# Patient Record
Sex: Female | Born: 1950 | Race: Black or African American | Hispanic: No | Smoking: Never smoker
Health system: Southern US, Community
[De-identification: ages and names within clinical notes are randomized; demographics above are authoritative.]

## PROBLEM LIST (undated history)

## (undated) DIAGNOSIS — E079 Disorder of thyroid, unspecified: Secondary | ICD-10-CM

## (undated) DIAGNOSIS — Z91018 Allergy to other foods: Secondary | ICD-10-CM

## (undated) HISTORY — PX: CHOLECYSTECTOMY: SHX55

## (undated) HISTORY — PX: KNEE SURGERY: SHX244

## (undated) HISTORY — PX: THYROIDECTOMY: SHX17

## (undated) HISTORY — PX: SMALL INTESTINE SURGERY: SHX150

## (undated) HISTORY — PX: CARPAL TUNNEL RELEASE: SHX101

## (undated) HISTORY — DX: Disorder of thyroid, unspecified: E07.9

## (undated) HISTORY — PX: TUMOR REMOVAL: SHX12

## (undated) HISTORY — PX: PARTIAL HYSTERECTOMY: SHX80

## (undated) HISTORY — DX: Allergy to other foods: Z91.018

---

## 2006-02-23 ENCOUNTER — Ambulatory Visit: Payer: Self-pay | Admitting: Orthopedic Surgery

## 2006-03-09 ENCOUNTER — Ambulatory Visit: Payer: Self-pay | Admitting: Orthopedic Surgery

## 2006-04-11 ENCOUNTER — Ambulatory Visit: Payer: Self-pay | Admitting: Orthopedic Surgery

## 2006-04-18 ENCOUNTER — Ambulatory Visit (HOSPITAL_COMMUNITY): Admission: RE | Admit: 2006-04-18 | Discharge: 2006-04-18 | Payer: Self-pay | Admitting: Orthopedic Surgery

## 2006-04-25 ENCOUNTER — Ambulatory Visit: Payer: Self-pay | Admitting: Orthopedic Surgery

## 2006-05-10 ENCOUNTER — Encounter: Payer: Self-pay | Admitting: Orthopedic Surgery

## 2006-05-10 ENCOUNTER — Ambulatory Visit (HOSPITAL_COMMUNITY): Admission: RE | Admit: 2006-05-10 | Discharge: 2006-05-10 | Payer: Self-pay | Admitting: Orthopedic Surgery

## 2006-05-12 ENCOUNTER — Ambulatory Visit: Payer: Self-pay | Admitting: Orthopedic Surgery

## 2006-05-23 ENCOUNTER — Ambulatory Visit: Payer: Self-pay | Admitting: Orthopedic Surgery

## 2006-10-18 ENCOUNTER — Ambulatory Visit: Payer: Self-pay | Admitting: Orthopedic Surgery

## 2006-11-01 ENCOUNTER — Ambulatory Visit (HOSPITAL_COMMUNITY): Admission: RE | Admit: 2006-11-01 | Discharge: 2006-11-01 | Payer: Self-pay | Admitting: Orthopedic Surgery

## 2006-11-01 ENCOUNTER — Ambulatory Visit: Payer: Self-pay | Admitting: Orthopedic Surgery

## 2006-11-02 ENCOUNTER — Ambulatory Visit: Payer: Self-pay | Admitting: Orthopedic Surgery

## 2006-11-16 ENCOUNTER — Ambulatory Visit: Payer: Self-pay | Admitting: Orthopedic Surgery

## 2006-11-30 ENCOUNTER — Ambulatory Visit: Payer: Self-pay | Admitting: Orthopedic Surgery

## 2006-12-21 ENCOUNTER — Ambulatory Visit: Payer: Self-pay | Admitting: Orthopedic Surgery

## 2007-01-19 ENCOUNTER — Ambulatory Visit: Payer: Self-pay | Admitting: Orthopedic Surgery

## 2007-02-15 ENCOUNTER — Ambulatory Visit: Payer: Self-pay | Admitting: Orthopedic Surgery

## 2007-03-29 ENCOUNTER — Ambulatory Visit: Payer: Self-pay | Admitting: Orthopedic Surgery

## 2007-05-11 ENCOUNTER — Ambulatory Visit: Payer: Self-pay | Admitting: Orthopedic Surgery

## 2007-06-26 ENCOUNTER — Ambulatory Visit: Payer: Self-pay | Admitting: Orthopedic Surgery

## 2007-12-27 DIAGNOSIS — G4730 Sleep apnea, unspecified: Secondary | ICD-10-CM | POA: Insufficient documentation

## 2007-12-27 DIAGNOSIS — J45909 Unspecified asthma, uncomplicated: Secondary | ICD-10-CM | POA: Insufficient documentation

## 2007-12-28 ENCOUNTER — Ambulatory Visit: Payer: Self-pay | Admitting: Orthopedic Surgery

## 2007-12-28 DIAGNOSIS — M25569 Pain in unspecified knee: Secondary | ICD-10-CM | POA: Insufficient documentation

## 2007-12-28 DIAGNOSIS — M654 Radial styloid tenosynovitis [de Quervain]: Secondary | ICD-10-CM | POA: Insufficient documentation

## 2007-12-28 DIAGNOSIS — IMO0002 Reserved for concepts with insufficient information to code with codable children: Secondary | ICD-10-CM | POA: Insufficient documentation

## 2007-12-28 DIAGNOSIS — M1710 Unilateral primary osteoarthritis, unspecified knee: Secondary | ICD-10-CM

## 2008-01-19 ENCOUNTER — Emergency Department (HOSPITAL_COMMUNITY): Admission: EM | Admit: 2008-01-19 | Discharge: 2008-01-19 | Payer: Self-pay | Admitting: Emergency Medicine

## 2008-04-23 ENCOUNTER — Telehealth: Payer: Self-pay | Admitting: Orthopedic Surgery

## 2008-04-25 ENCOUNTER — Encounter: Payer: Self-pay | Admitting: Infectious Diseases

## 2008-07-04 ENCOUNTER — Ambulatory Visit: Payer: Self-pay | Admitting: Orthopedic Surgery

## 2009-01-15 ENCOUNTER — Ambulatory Visit: Payer: Self-pay | Admitting: Orthopedic Surgery

## 2009-01-27 ENCOUNTER — Telehealth: Payer: Self-pay | Admitting: Orthopedic Surgery

## 2009-12-26 ENCOUNTER — Emergency Department (HOSPITAL_COMMUNITY): Admission: EM | Admit: 2009-12-26 | Discharge: 2009-12-27 | Payer: Self-pay | Admitting: Emergency Medicine

## 2010-01-15 DIAGNOSIS — K8050 Calculus of bile duct without cholangitis or cholecystitis without obstruction: Secondary | ICD-10-CM | POA: Insufficient documentation

## 2010-07-11 ENCOUNTER — Emergency Department (HOSPITAL_COMMUNITY): Admission: EM | Admit: 2010-07-11 | Discharge: 2010-07-11 | Payer: Self-pay | Admitting: Emergency Medicine

## 2011-02-28 LAB — URINALYSIS, ROUTINE W REFLEX MICROSCOPIC
Glucose, UA: NEGATIVE mg/dL
Hgb urine dipstick: NEGATIVE
Urobilinogen, UA: 0.2 mg/dL (ref 0.0–1.0)

## 2011-02-28 LAB — DIFFERENTIAL
Basophils Relative: 0 % (ref 0–1)
Lymphs Abs: 1.7 10*3/uL (ref 0.7–4.0)
Monocytes Absolute: 0.4 10*3/uL (ref 0.1–1.0)
Monocytes Relative: 5 % (ref 3–12)
Neutro Abs: 6.7 10*3/uL (ref 1.7–7.7)

## 2011-02-28 LAB — URINE MICROSCOPIC-ADD ON

## 2011-02-28 LAB — COMPREHENSIVE METABOLIC PANEL
AST: 28 U/L (ref 0–37)
Albumin: 4.2 g/dL (ref 3.5–5.2)
Alkaline Phosphatase: 147 U/L — ABNORMAL HIGH (ref 39–117)
BUN: 16 mg/dL (ref 6–23)
CO2: 25 mEq/L (ref 19–32)
Chloride: 103 mEq/L (ref 96–112)
Creatinine, Ser: 0.89 mg/dL (ref 0.4–1.2)
GFR calc Af Amer: >60 mL/min (ref >60)
Potassium: 4.4 mEq/L (ref 3.5–5.1)
Total Protein: 8 g/dL (ref 6.0–8.3)

## 2011-02-28 LAB — URINE CULTURE: Colony Count: 90000

## 2011-02-28 LAB — LIPASE, BLOOD: Lipase: 23 U/L (ref 11–59)

## 2011-02-28 LAB — CBC
HCT: 41.1 % (ref 36.0–46.0)
Hemoglobin: 13.6 g/dL (ref 12.0–15.0)
MCHC: 33 g/dL (ref 30.0–36.0)
MCV: 90.9 fL (ref 78.0–100.0)
Platelets: 208 K/uL (ref 150–400)
RBC: 4.52 MIL/uL (ref 3.87–5.11)
RDW: 16.4 % — ABNORMAL HIGH (ref 11.5–15.5)
WBC: 8.8 K/uL (ref 4.0–10.5)

## 2011-04-30 NOTE — H&P (Signed)
NAMEARNOLA, [name]            ACCOUNT NO.:  [id number]   MEDICAL RECORD NO.:  [id number]          PATIENT TYPE:  AMB   LOCATION:  DAY                           FACILITY:  [facility]   PHYSICIAN:  Vickki Hearing, M.D.DATE OF BIRTH:  1951-03-24   DATE OF ADMISSION:  DATE OF DISCHARGE:  LH                                HISTORY & PHYSICAL   CHIEF COMPLAINT:  Left knee pain.   HISTORY:  This is a 60 year old female who I have operated on for triggering  of the left thumb who presents with chronic left knee pain with MRI findings  indicating ligamentous injury, cartilage damage. She has not responded to  nonoperative treatment including bracing, anti-inflammatories. Symptoms have  become worse.  She presents for surgery. Presumptive diagnosis is ACL tear  and osteoarthritis, possible meniscal tear.   REVIEW OF SYSTEMS:  The patient's review of systems, she has a history of  weight loss and weight gain, fatigue, chest pain, shortness breath,  difficulty breathing, difficulty swallowing, gastroesophageal reflux,  headache, migraine, joint pain and swelling, thyroid disease, goiter,  depression, mood swing, eczema, itching, poor vision, sinusitis, toothache,  seasonal allergies.  She is also allergic to bee stings.  She has normal  genitourinary system and normal lymphatic system.   ALLERGIES:  Include penicillin, aspirin, codeine and multiple foods.   PAST MEDICAL HISTORY:  Problems of asthma, sleep apnea, Graves disease,  thyroid disease.   PAST SURGICAL HISTORY:  Hysterectomy, thyroidectomy, trigger release right  and left thumb.   MEDICATIONS:  1. Advair discus.  2. Synthroid 200 mcg a day.  3. Os-Cal 500 mg.  4. Docusate sodium 100 mg.  5. Nasacort aqueous.  6. Flonase 50 mg.  7. Protonix 40 mg.  8. Zoloft 25 mg.  9. Claritin 10 mg.  10.Zomig 2.5 mg as needed.  11.Singulair 10 mg.   FAMILY HISTORY:  Arthritis, cancer and asthma.   SOCIAL HISTORY:  She is followed  by a physician in [place]. She is single.  She does not smoke, drink or use caffeine. Has a bachelor of science degree.   PHYSICAL EXAMINATION:  CONSTITUTIONAL:  Findings show normal vital signs.  VITALS:  Weight of approximately 210.  Body habitus, mesomorphic, nutrition  development, grooming and hygiene are normal.  CARDIOVASCULAR:  Shows normal  peripheral vascular system by observation and palpation.  CHEST: Clear.  HEART:  Rate and rhythm normal.  ABDOMEN:  Soft.  LYMPH SYSTEM:  Neck is  supple and without mass. MUSCULOSKELETAL:  Left knee shows medial  compartment tenderness. It is  positive meniscal signs.  Drawer test appears  stable. Lachman test appears pretty stable. Collateral ligaments appear  stable.  Motor exam 5/5 manual muscle testing. No joint effusion is noted.  NEURO:  She was awake, alert and oriented x3.  Sensory findings were normal.   IMPRESSION:  ACL tear, osteoarthritis/cartilage damage. Possible meniscal  tear.   PLAN:  Arthroscopy left knee.   The patient understands the following:  That surgery may or may not help her  knee.  I do not think she is a reconstruction surgical candidate  based on  age and activity level.  She may have trouble after this with her arthritis  or instability episodes. At this point we will clean the knee out as best we  can.  We will address any cartilage issues as they are encountered.  The ACL  will be addressed with debridement if it is causing impingement in the  joint.  Otherwise it will be left as is.      Vickki Hearing, M.D.  Electronically Signed     SEH/MEDQ  D:  10/31/2006  T:  10/31/2006  Job:  [id number]

## 2011-04-30 NOTE — Op Note (Signed)
NAMEVERBIE, BABIC            ACCOUNT NO.:  [id number]   MEDICAL RECORD NO.:  [id number]          PATIENT TYPE:  AMB   LOCATION:  DAY                           FACILITY:  [facility]   PHYSICIAN:  Vickki Hearing, M.D.DATE OF BIRTH:  [birth year]   DATE OF PROCEDURE:  05/10/2006  DATE OF DISCHARGE:                                 OPERATIVE REPORT   PREOPERATIVE DIAGNOSIS:  Trigger thumb, left upper extremity.   POSTOPERATIVE DIAGNOSIS:  Trigger thumb, left upper extremity.   PROCEDURE:  A1 pulley release, left thumb.   SURGEON:  Vickki Hearing, M.D.   ANESTHETIC:  Regional block.   FINDINGS:  Stenotic A1 pulley.  No other findings.  Steroid medication  previous injected was noted.   HISTORY:  A 60 year old female with chronic triggering treated with  injection did not improve, presented for A1 pulley release due to persistent  pain and locking of the finger.   The patient was identified as [name] in the preop holding area.  Her surgical site was marked, countersigned by the surgeon, vancomycin was  administered and she was taken to the operating room Bier block.  After  successful Bier block, sterile prep and drape was performed, time-out was  completed.  The procedure was confirmed as an A1 pulley release from the  left thumb on [employer].  Antibiotics had been given.   An incision was made in the transverse crease of the metacarpophalangeal  joint area.  The nerve was seen, encountered and protected, the A1 pulley  was released.  Flexion/extension of the finger confirmed good mobility of  the tendon with no locking or clicking, and the wound was irrigated and  closed in interrupted fashion with 3-0 nylon suture.   The patient was taken to the recovery room in stable addition, discharged on  Vicodin 5 mg one to two p.o. q.4h. p.r.n. for pain, #60.  Follow-up  scheduled for 2 days.      Vickki Hearing, M.D.  Electronically Signed     SEH/MEDQ  D:  05/10/2006  T:  05/10/2006  Job:  [id number]

## 2011-04-30 NOTE — Op Note (Signed)
NAMEMARSHAE, [name]            ACCOUNT NO.:  [id number]   MEDICAL RECORD NO.:  [id number]          PATIENT TYPE:  AMB   LOCATION:  DAY                           FACILITY:  [facility]   PHYSICIAN:  Vickki Hearing, M.D.DATE OF BIRTH:  1951/08/01   DATE OF PROCEDURE:  11/01/2006  DATE OF DISCHARGE:                               OPERATIVE REPORT   HISTORY:  This is a 60 year old female who failed conservative treatment  for left knee pain.  MRI showed possible medial meniscal tear chondral  injury with osteoarthritis, possible ACL tear.  The patient was having  instability and mechanical symptoms and presented for surgery.   PREOP DIAGNOSES:  1. Torn medial meniscus, left knee.  2. Osteoarthritis, left knee.  3. Anterior cruciate ligament tear, left knee.   POSTOP DIAGNOSES:  1. Osteoarthritis, left knee.  2. Torn medial meniscus, left knee.   OPERATIVE FINDINGS:  There was a free edge tear of the medial meniscus.  There was a chondral flap with loose cartilaginous flap of noted from  the medial femoral condyle.  There were grade 3 changes on the medial  femoral, grade 2 changes on the patella, and grade 2 changes on the  trochlea.  The medial femoral condylar lesion involved a significant  part of the weightbearing surface of the femur.  The lateral meniscus  was intact and normal.  ACL was also intact as was the PCL.  There was  some mild sinusitis.   The patient was identified as [name], her left knee was marked  as the surgical site, and countersigned by the surgeon.  The history and  physical was updated.  The patient was allergic to several medications  including PENICILLIN which caused swelling.  We opted to go with  clindamycin; this medicine was started, and confirmed to be given within  an hour of skin incision.   The patient was taken back to the operating room where a spinal  anesthetic was done.  She was placed supine on the operating table and  her  left leg was placed in an arthroscopic leg holder.  Right leg was  padded and left in flexion.  After sterile prep and drape of the left  lower extremity.  The time-out procedure was completed; and the  procedure was confirmed as a left knee arthroscopy, on [name];  again, antibiotics confirmed to be given within an hour of skin incision  using clindamycin.   A lateral portal was established with an 11-blade a diagnostic  arthroscopy was done viewing all compartments and gutters of the knee.  A medial portal was established as a working portal; and a probe was  inserted and the diagnostic arthroscopy was completed using the probe to  palpate the structures.   A straight duckbill forceps was used to trim the free edge tear of the  posterior horn of the medial meniscus.  The meniscus was then balanced  using a motorized shaver, a gentle chondroplasty was performed on the  medial femoral condyle until a stable rim was reached.   The scope was placed in the patellofemoral compartment where  a  chondroplasty was done on the patella, primarily involving the medial  facette and this was done until the crab-meat appearance had been  converted to a flattened surface.  The knee was then irrigated; and the  portals were closed using Steri-Strips.  An additional 20 mL of Marcaine  was injected into the knee via mid lateral patellar portal injection  site; and the knee was dressed sterilely and a cryo cuff was applied and  activated.   The patient was taken to the recovery room in stable condition.  She is  postop full weightbearing, follow up will be tomorrow.  She is  discharged on Dilaudid due to CODEINE allergy.  She is full  weightbearing as tolerated.      Vickki Hearing, M.D.  Electronically Signed     SEH/MEDQ  D:  11/01/2006  T:  11/01/2006  Job:  [id number]

## 2011-04-30 NOTE — H&P (Signed)
[name], [name]            ACCOUNT NO.:  [id number]   MEDICAL RECORD NO.:  [id number]          PATIENT TYPE:  AMB   LOCATION:  DAY                           FACILITY:  [facility]   PHYSICIAN:  Vickki Hearing, M.D.DATE OF BIRTH:  [birth year]   DATE OF ADMISSION:  DATE OF DISCHARGE:  LH                                HISTORY & PHYSICAL   CHIEF COMPLAINT:  Left thumb triggering.   HISTORY OF PRESENT ILLNESS:  This patient is 60 years old, has triggering of  the left thumb.  She has been treated with injections.  She has recurrence  of symptoms with persistent pain in the palm over the A-1 pulley and  presents for trigger thumb release.   REVIEW OF SYSTEMS:  A ten point review of systems reveals a history of  weight loss and gain, fatigue, chest pain, shortness of breath, difficulty  breathing, difficulty swallowing, gastroesophageal reflux disease, headache,  migraine, joint swelling, pain, thyroid disease, goiter, depression, mood  swing, eczema, itching, poor vision, sinusitis, toothaches, seasonal  allergies, ALLERGY to BEE STINGS. Urinary and lymph systems were normal.   ALLERGIES:  PENICILLIN, ASPIRIN, CODEINE, MULTIPLE FOOD ALLERGIES.   PAST MEDICAL HISTORY:  Medical problems of asthma, sleep apnea, Grave's  disease, thyroid disease.   PAST SURGICAL HISTORY:  Hysterectomy, thyroidectomy, right thumb trigger  release.   MEDICATIONS:  1.  Advair Diskus.  2.  Synthroid 200 mcg daily.  3.  Os-Cal 500 mg.  4.  Docusate sodium 100 mg.  5.  Nasacort AQ.  6.  Flonase 50 mcg.  7.  Protonix 40 mg.  8.  Zoloft 25 mg.  9.  Claritin 10 mg.  10. Zomig 2.5.  11. Singulair 10 mg.   FAMILY HISTORY:  Arthritis, cancer and asthma.   SOCIAL HISTORY:  She is followed in [place].  She is single. She does not  smoke, drink or use caffeine.  She has a Nature conservation officer.   PHYSICAL EXAMINATION:  VITAL SIGNS:  Constitutional findings show weight of  217. Pulse 90.   Respiratory rate 20.  Body habitus mesomorphic. Nutrition,  development, grooming, hygiene were normal.  CARDIOVASCULAR:  Peripheral vascular system observation normal palpation of  pulses, temperature, no edema, tenderness, swelling or varicose veins.  LYMPH SYSTEM:  Neck, epitrochlea regions normal.  MUSCULOSKELETAL:  Gait and station unremarkable.  Carpal tunnel signs were  negative.  Left thumb over the A-1 pulley showed tenderness and clicking,  though passive range of motion was normal.  Grip strength was poor.  All  joints were stable.  No swelling was noted.  Coordination, reflexes were  normal.  She was oriented x3.  Mood was normal.  Sensory findings to soft  touch were normal.   CLINICAL DATA:  No x-rays.   DIAGNOSIS:  Tenosynovitis of the flexor tendon of the left thumb.   RECOMMENDATIONS:  Trigger release of the left thumb.      Vickki Hearing, M.D.  Electronically Signed     SEH/MEDQ  D:  05/09/2006  T:  05/09/2006  Job:  [id number]   cc:  [facility]  Fax: [phone]

## 2011-09-03 LAB — URINALYSIS, ROUTINE W REFLEX MICROSCOPIC
Glucose, UA: NEGATIVE
Hgb urine dipstick: NEGATIVE
Nitrite: NEGATIVE
Protein, ur: NEGATIVE

## 2011-09-03 LAB — URINE CULTURE: Colony Count: 3000

## 2011-11-25 ENCOUNTER — Encounter: Payer: Self-pay | Admitting: Orthopedic Surgery

## 2011-11-25 ENCOUNTER — Ambulatory Visit (INDEPENDENT_AMBULATORY_CARE_PROVIDER_SITE_OTHER): Payer: BC Managed Care – PPO | Admitting: Orthopedic Surgery

## 2011-11-25 VITALS — BP 100/60 | Ht 61.0 in | Wt 222.0 lb

## 2011-11-25 DIAGNOSIS — IMO0002 Reserved for concepts with insufficient information to code with codable children: Secondary | ICD-10-CM

## 2011-11-25 DIAGNOSIS — M6530 Trigger finger, unspecified finger: Secondary | ICD-10-CM

## 2011-11-25 DIAGNOSIS — M1710 Unilateral primary osteoarthritis, unspecified knee: Secondary | ICD-10-CM | POA: Insufficient documentation

## 2011-11-25 DIAGNOSIS — M179 Osteoarthritis of knee, unspecified: Secondary | ICD-10-CM

## 2011-11-25 NOTE — Patient Instructions (Signed)
You have received a steroid shot. 15% of patients experience increased pain at the injection site with in the next 24 hours. This is best treated with ice and tylenol extra strength 2 tabs every 8 hours. If you are still having pain please call the office.    

## 2011-11-25 NOTE — Progress Notes (Signed)
Patient ID: [name], female   DOB: 1951-11-01, 60 y.o.   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  She also has thyroid disease.  She complains of catching and locking of the long fingers of both the RIGHT and LEFT hand with severe pain and locking requiring digital manipulation  Her knees are improved with ibuprofen though still symptomatic with mild to moderate pain depending on her activities.  She does not have any catching locking or giving way.  Past Medical History  Diagnosis Date  . Thyroid disease   . Asthma   . Allergic rhinitis   . Food allergy    Past Surgical History  Procedure Date  . Partial hysterectomy   . Thyroidectomy   . Cholecystectomy   . Carpal tunnel release     bilateral  . Knee surgery     left  . Tumor removal   . Small intestine surgery    No current outpatient prescriptions on file prior to visit.    X-rays separately unidentifiable  Bilateral knee films 3 views RIGHT and LEFT knee  RIGHT knee first  Alignment slight varus, joint space narrowing medial and lateral and patellofemoral.  Patellofemoral articulation the patella centered.  Impression osteoarthritis RIGHT knee moderate  LEFT knee aligned a slight varus, joint space narrowing medial and lateral and patellofemoral with patello-femoral articulation degenerative with cemented patella  Impression osteoarthritis LEFT knee moderate  I gave her to trigger finger injections  Procedure note trigger finger injection  Diagnosis trigger finger Postop diagnosis trigger finger Procedure injection of trigger finger Finger injected RIGHT long finger and LEFT long finger  Details of procedure: After verbal consent and timeout to confirm site the RIGHT long finger was injected with 1 cc of 40 mg of  Depo-Medrol and 1 cc of 1% lidocaine this was then repeated on the LEFT finger  The procedure was tolerated well without complication

## 2012-05-30 ENCOUNTER — Ambulatory Visit: Payer: BC Managed Care – PPO | Admitting: Orthopedic Surgery

## 2012-05-31 ENCOUNTER — Ambulatory Visit (INDEPENDENT_AMBULATORY_CARE_PROVIDER_SITE_OTHER): Payer: BC Managed Care – PPO | Admitting: Orthopedic Surgery

## 2012-05-31 ENCOUNTER — Encounter: Payer: Self-pay | Admitting: Orthopedic Surgery

## 2012-05-31 VITALS — BP 90/68 | Ht 61.0 in | Wt 230.0 lb

## 2012-05-31 DIAGNOSIS — M179 Osteoarthritis of knee, unspecified: Secondary | ICD-10-CM

## 2012-05-31 DIAGNOSIS — M1710 Unilateral primary osteoarthritis, unspecified knee: Secondary | ICD-10-CM

## 2012-05-31 DIAGNOSIS — IMO0002 Reserved for concepts with insufficient information to code with codable children: Secondary | ICD-10-CM

## 2012-05-31 DIAGNOSIS — M6530 Trigger finger, unspecified finger: Secondary | ICD-10-CM

## 2012-05-31 NOTE — Patient Instructions (Addendum)
You have received a steroid shot. 15% of patients experience increased pain at the injection site with in the next 24 hours. This is best treated with ice and tylenol extra strength 2 tabs every 8 hours. If you are still having pain please call the office.    

## 2012-06-05 ENCOUNTER — Encounter: Payer: Self-pay | Admitting: Orthopedic Surgery

## 2012-06-05 NOTE — Progress Notes (Signed)
Patient ID: [name], female   DOB: 07/31/1951, 61 y.o.   MRN: [id number] Chief Complaint  Patient presents with  . Follow-up    6 month recheck on knees.    She also has triggering of her right upper extremity long finger  Bilateral knee pain aching, denies loss of motion denies weakness denies locking clicking or mechanical symptoms  Past Medical History  Diagnosis Date  . Thyroid disease   . Asthma   . Allergic rhinitis   . Food allergy     Past Surgical History  Procedure Date  . Partial hysterectomy   . Thyroidectomy   . Cholecystectomy   . Carpal tunnel release     bilateral  . Knee surgery     left  . Tumor removal   . Small intestine surgery     BP 90/68  Ht 5\' 1"  (1.549 m)  Wt 104.327 kg (230 lb)  BMI 43.46 kg/m2  General appearance patient is mildly obese but otherwise normal in appearance  She is alert or oriented x3 mood and affect are normal  Skin warm dry and intact right hand and both lower extremities  Pulses normal right upper extremity and both lower extremity  No edema  Neurological exam intact  Right and left knee range of motion and exam is similar with flexion arc 120, Varus deformity to medial joint line tenderness ligaments are stable muscle tone is normal. Right hand tenderness over the A1 pulley of the long finger with no locking or clicking. Flexion power is normal. Appearance of the hand shows a normal cascade of the hand. Her vascular exam intact.  Impression bilateral osteoarthritis of the knee Impression right trigger finger  The patient would like injections done on all 3 areas  Knee  Injection Procedure Note  Pre-operative Diagnosis: left knee oa  Post-operative Diagnosis: same  Indications: pain  Anesthesia: ethyl chloride   Procedure Details   Verbal consent was obtained for the procedure. Time out was completed.The joint was prepped with alcohol, followed by  Ethyl chloride spray and A 20 gauge needle was  inserted into the knee via lateral approach; 4ml 1% lidocaine and 1 ml of depomedrol  was then injected into the joint . The needle was removed and the area cleansed and dressed.  Complications:  None; patient tolerated the procedure well.  Knee  Injection Procedure Note  Pre-operative Diagnosis: right knee oa  Post-operative Diagnosis: same  Indications: pain  Anesthesia: ethyl chloride   Procedure Details   Verbal consent was obtained for the procedure. Time out was completed.The joint was prepped with alcohol, followed by  Ethyl chloride spray and A 20 gauge needle was inserted into the knee via lateral approach; 4ml 1% lidocaine and 1 ml of depomedrol  was then injected into the joint . The needle was removed and the area cleansed and dressed.  Complications:  None; patient tolerated the procedure well.  Procedure note trigger finger injection  Diagnosis trigger finger Postop diagnosis trigger finger Procedure injection of trigger finger Finger injected RIGHT long finger  Details of procedure: After verbal consent and timeout to confirm site the RIGHT long finger was injected with 1 cc of 40 mg of Depo-Medrol and 1 cc of 1% lidocaine  The procedure was tolerated well without complication

## 2012-11-29 ENCOUNTER — Encounter: Payer: Self-pay | Admitting: Orthopedic Surgery

## 2012-11-29 ENCOUNTER — Ambulatory Visit (INDEPENDENT_AMBULATORY_CARE_PROVIDER_SITE_OTHER): Payer: BC Managed Care – PPO | Admitting: Orthopedic Surgery

## 2012-11-29 VITALS — BP 110/80 | Ht 61.0 in | Wt 230.0 lb

## 2012-11-29 DIAGNOSIS — M6530 Trigger finger, unspecified finger: Secondary | ICD-10-CM

## 2012-11-29 DIAGNOSIS — M179 Osteoarthritis of knee, unspecified: Secondary | ICD-10-CM

## 2012-11-29 DIAGNOSIS — M1710 Unilateral primary osteoarthritis, unspecified knee: Secondary | ICD-10-CM

## 2012-11-29 DIAGNOSIS — IMO0002 Reserved for concepts with insufficient information to code with codable children: Secondary | ICD-10-CM

## 2012-11-29 NOTE — Progress Notes (Signed)
Patient ID: [name], female   DOB: Jun 12, 1951, 61 y.o.   MRN: [id number] Chief Complaint  Patient presents with  . Follow-up    6 month recheck knees    Wants shots:  Knee  Injection Procedure Note  Pre-operative Diagnosis: left knee oa  Post-operative Diagnosis: same  Indications: pain  Anesthesia: ethyl chloride   Procedure Details   Verbal consent was obtained for the procedure. Time out was completed.The joint was prepped with alcohol, followed by  Ethyl chloride spray and A 20 gauge needle was inserted into the knee via lateral approach; 4ml 1% lidocaine and 1 ml of depomedrol  was then injected into the joint . The needle was removed and the area cleansed and dressed.  Complications:  None; patient tolerated the procedure well.  Knee  Injection Procedure Note  Pre-operative Diagnosis: right knee oa  Post-operative Diagnosis: same  Indications: pain  Anesthesia: ethyl chloride   Procedure Details   Verbal consent was obtained for the procedure. Time out was completed.The joint was prepped with alcohol, followed by  Ethyl chloride spray and A 20 gauge needle was inserted into the knee via lateral approach; 4ml 1% lidocaine and 1 ml of depomedrol  was then injected into the joint . The needle was removed and the area cleansed and dressed.  Complications:  None; patient tolerated the procedure well. Procedure note trigger finger injection  Diagnosis trigger finger Postop diagnosis trigger finger Procedure injection of trigger finger Finger injected RIGHT long finger and LEFT long finger  Details of procedure: After verbal consent and timeout to confirm site the RIGHT long finger was injected with 1 cc of 40 mg of Depo-Medrol and 1 cc of 1% lidocaine this was then repeated on the LEFT finger  The procedure was tolerated well without complication

## 2012-11-29 NOTE — Patient Instructions (Addendum)
You have received a steroid shot. 15% of patients experience increased pain at the injection site with in the next 24 hours. This is best treated with ice and tylenol extra strength 2 tabs every 8 hours. If you are still having pain please call the office.    

## 2012-12-01 ENCOUNTER — Telehealth: Payer: Self-pay | Admitting: Orthopedic Surgery

## 2013-02-28 ENCOUNTER — Ambulatory Visit: Payer: BC Managed Care – PPO | Admitting: Orthopedic Surgery

## 2013-03-15 ENCOUNTER — Ambulatory Visit: Payer: BC Managed Care – PPO | Admitting: Orthopedic Surgery

## 2013-03-20 ENCOUNTER — Encounter: Payer: Self-pay | Admitting: Orthopedic Surgery

## 2013-03-20 ENCOUNTER — Ambulatory Visit (INDEPENDENT_AMBULATORY_CARE_PROVIDER_SITE_OTHER): Payer: BC Managed Care – PPO | Admitting: Orthopedic Surgery

## 2013-03-20 VITALS — BP 120/78 | Ht 61.0 in | Wt 230.0 lb

## 2013-03-20 DIAGNOSIS — M6530 Trigger finger, unspecified finger: Secondary | ICD-10-CM

## 2013-03-20 DIAGNOSIS — M179 Osteoarthritis of knee, unspecified: Secondary | ICD-10-CM

## 2013-03-20 DIAGNOSIS — IMO0002 Reserved for concepts with insufficient information to code with codable children: Secondary | ICD-10-CM

## 2013-03-20 DIAGNOSIS — M1710 Unilateral primary osteoarthritis, unspecified knee: Secondary | ICD-10-CM

## 2013-03-20 NOTE — Progress Notes (Signed)
Patient ID: [name], female   DOB: 1951-01-02, 62 y.o.   MRN: [id number] Chief Complaint  Patient presents with  . Follow-up    3 month recheck trigger finger and knees    BP 120/78  Ht 5\' 1"  (1.549 m)  Wt 230 lb (104.327 kg)  BMI 43.48 kg/m2  Bilateral trigger fingers right long finger left long finger bilateral knee joint pain osteoarthritis left and right knee patient requests injections x4  Knee  Injection Procedure Note  Pre-operative Diagnosis: left knee oa  Post-operative Diagnosis: same  Indications: pain  Anesthesia: ethyl chloride   Procedure Details   Verbal consent was obtained for the procedure. Time out was completed.The joint was prepped with alcohol, followed by  Ethyl chloride spray and A 20 gauge needle was inserted into the knee via lateral approach; 4ml 1% lidocaine and 1 ml of depomedrol  was then injected into the joint . The needle was removed and the area cleansed and dressed.  Complications:  None; patient tolerated the procedure well. Knee  Injection Procedure Note  Pre-operative Diagnosis: right knee oa  Post-operative Diagnosis: same  Indications: pain  Anesthesia: ethyl chloride   Procedure Details   Verbal consent was obtained for the procedure. Time out was completed.The joint was prepped with alcohol, followed by  Ethyl chloride spray and A 20 gauge needle was inserted into the knee via lateral approach; 4ml 1% lidocaine and 1 ml of depomedrol  was then injected into the joint . The needle was removed and the area cleansed and dressed.  Complications:  None; patient tolerated the procedure well.  Trigger Finger  Injection Procedure Note  Pre-operative Diagnosis: BILATERAL LONG FINGER trigger finger  Post-operative Diagnosis:  Same  Indications: pain and locking with swelling  Procedure Details  Verbal consent for the procedure was obtained. After taking the timeout to confirm the site the skin was cleaned with alcohol and  anesthetized with ethyl chloride.  1 mL of Depo-Medrol was then injected under the A1 pulley.  Complications: none  This was repeated right and left long finger

## 2013-03-20 NOTE — Patient Instructions (Signed)
You have received a steroid shot. 15% of patients experience increased pain at the injection site with in the next 24 hours. This is best treated with ice and tylenol extra strength 2 tabs every 8 hours. If you are still having pain please call the office.    

## 2014-09-26 DIAGNOSIS — K219 Gastro-esophageal reflux disease without esophagitis: Secondary | ICD-10-CM | POA: Insufficient documentation

## 2017-05-18 ENCOUNTER — Other Ambulatory Visit (HOSPITAL_COMMUNITY): Payer: Self-pay | Admitting: Primary Care

## 2017-05-18 ENCOUNTER — Ambulatory Visit (HOSPITAL_COMMUNITY)
Admission: RE | Admit: 2017-05-18 | Discharge: 2017-05-18 | Disposition: A | Discharge status: Home / Self Care | Payer: PRIVATE HEALTH INSURANCE | Source: Ambulatory Visit | Attending: Primary Care | Admitting: Primary Care

## 2017-05-18 DIAGNOSIS — M7731 Calcaneal spur, right foot: Secondary | ICD-10-CM | POA: Insufficient documentation

## 2017-05-18 DIAGNOSIS — M79671 Pain in right foot: Secondary | ICD-10-CM

## 2017-05-18 DIAGNOSIS — M7989 Other specified soft tissue disorders: Secondary | ICD-10-CM | POA: Diagnosis not present

## 2017-05-23 ENCOUNTER — Encounter: Payer: Self-pay | Admitting: Orthopedic Surgery

## 2017-05-23 ENCOUNTER — Ambulatory Visit (INDEPENDENT_AMBULATORY_CARE_PROVIDER_SITE_OTHER): Payer: PRIVATE HEALTH INSURANCE | Admitting: Orthopedic Surgery

## 2017-05-23 VITALS — BP 139/87 | HR 91 | Wt 223.0 lb

## 2017-05-23 DIAGNOSIS — M1712 Unilateral primary osteoarthritis, left knee: Secondary | ICD-10-CM

## 2017-05-23 DIAGNOSIS — S99922A Unspecified injury of left foot, initial encounter: Secondary | ICD-10-CM

## 2017-05-23 NOTE — Patient Instructions (Signed)
ICE THE FOOT AT NIGHT FOR 20 MIN   WEAR SHOE FOR 4 WEEKS OR UNTIL PAIN RESOLVES

## 2017-05-23 NOTE — Progress Notes (Signed)
  NEW PATIENT OFFICE VISIT    Chief Complaint  Patient presents with  . Foot Injury    RIGHT FOOT TWISTING INJURY, DOI 05/17/17    She is a 66 year old female who twisted her right foot on June 5 6 days ago complains of pain over the right lateral foot near the fifth metatarsal and some discomfort at the anterior talofibular ligament. She is having some trouble walking. She describes a dull constant aching pain which is worse with weightbearing.  X-ray was taken of her foot it was normal other than chronic arthritic changes    Review of Systems  Constitutional: Negative for chills and fever.  Musculoskeletal: Positive for joint pain.       Pain left knee history of osteoarthritis left knee treated with bracing status post arthroscopy left knee  Neurological: Negative for tingling.     Past Medical History:  Diagnosis Date  . Allergic rhinitis   . Asthma   . Food allergy   . Thyroid disease     Past Surgical History:  Procedure Laterality Date  . CARPAL TUNNEL RELEASE     bilateral  . CHOLECYSTECTOMY    . KNEE SURGERY     left  . PARTIAL HYSTERECTOMY    . SMALL INTESTINE SURGERY    . THYROIDECTOMY    . TUMOR REMOVAL      Family History  Problem Relation Age of Onset  . Cancer Unknown    Social History  Substance Use Topics  . Smoking status: Never Smoker  . Smokeless tobacco: Never Used  . Alcohol use No    BP 139/87   Pulse 91   Wt 223 lb (101.2 kg)   BMI 42.14 kg/m   Physical Exam  Right Ankle Exam  Swelling: mild  Tenderness  The patient is experiencing tenderness in the ATF.    Range of Motion  Dorsiflexion: normal  Plantar flexion: normal  Inversion: normal  Eversion: abnormal   Muscle Strength  Dorsiflexion:  5/5 Plantar flexion:  5/5 Gastrocsoleus:  5/5 Peroneal muscle:  4/5  Tests  Anterior drawer: negative Varus tilt: negative  Other  Erythema: absent Scars: absent Sensation: normal Pulse: present   Comments:  Area of  maximal tenderness is over the proximal fifth metatarsal   Left Ankle Exam  Swelling: none  Tenderness  The patient is experiencing no tenderness.   Range of Motion  The patient has normal left ankle ROM.   Muscle Strength  The patient has normal left ankle strength.  Tests  Anterior drawer: negative  Other  Erythema: absent Scars: absent Sensation: normal Pulse: present      No orders of the defined types were placed in this encounter.   Encounter Diagnoses  Name Primary?  Marland Kitchen. Foot injury, left, initial encounter Yes  . Primary osteoarthritis of left knee      PLAN:   Plain films show no evidence of fracture. Clinical exam shows that she probably strained her peroneal tendons  She has chronic osteoarthritis and pain in her left knee and her brace is worn out. Recommend re-fitting of brace today  Follow-up 6 weeks for foot, she was placed in a postop shoe

## 2017-07-04 ENCOUNTER — Encounter: Payer: Self-pay | Admitting: Orthopedic Surgery

## 2017-07-04 ENCOUNTER — Ambulatory Visit (INDEPENDENT_AMBULATORY_CARE_PROVIDER_SITE_OTHER): Payer: Medicare Other | Admitting: Orthopedic Surgery

## 2017-07-04 DIAGNOSIS — S99922D Unspecified injury of left foot, subsequent encounter: Secondary | ICD-10-CM

## 2017-07-04 NOTE — Progress Notes (Signed)
Follow up visit   Chief Complaint  Patient presents with  . Follow-up    6 week recheck on right foot, DOI 05-17-17.     She is a 66 year old female who twisted her right foot on June 5 6 days ago complains of pain over the right lateral foot near the fifth metatarsal and some discomfort at the anterior talofibular ligament. She is having some trouble walking. She describes a dull constant aching pain which is worse with weightbearing.   X-ray was taken of her foot it was normal other than chronic arthritic changes   Encounter Diagnosis  Name Primary?  Marland Kitchen. Foot injury, left, subsequent encounter Yes   The patient has been in the postop shoe for approximately 6 weeks. She has not improved significantly enough to take the shoe off. She actually try to take it off in the foot was still hurting  The pain is over the lateral and inferior aspect of the right foot  System review no system review changes from the prior visit on June 11 Review of Systems  Constitutional: Negative for chills and fever.  Musculoskeletal: Positive for joint pain.       Pain left knee history of osteoarthritis left knee treated with bracing status post arthroscopy left knee  Neurological: Negative for tingling.   Exam the patient is ambulating with a limp and a postop shoe. She has tenderness in the posterior inferior aspect of the right foot specifically under the fifth metatarsal proximal and distal. Ankle range of motion is normal she does have pes planus ankle stable motor exams intact neurovascular exam normal  Impression  Encounter Diagnosis  Name Primary?  Marland Kitchen. Foot injury, left, subsequent encounter Yes    Continue hard sole shoe return 4-5 weeks

## 2017-08-08 ENCOUNTER — Encounter: Payer: Self-pay | Admitting: Orthopedic Surgery

## 2017-08-08 ENCOUNTER — Ambulatory Visit (INDEPENDENT_AMBULATORY_CARE_PROVIDER_SITE_OTHER): Payer: Medicare Other | Admitting: Orthopedic Surgery

## 2017-08-08 DIAGNOSIS — S99922D Unspecified injury of left foot, subsequent encounter: Secondary | ICD-10-CM | POA: Diagnosis not present

## 2017-08-08 NOTE — Progress Notes (Addendum)
SUBJECTIVE: [name] is a 66 y.o. female She is a 66 year old female who twisted her right foot on June 5 6 days ago complains of pain over the right lateral foot near the fifth metatarsal and some discomfort at the anterior talofibular ligament. She is having some trouble walking. She describes a dull constant aching pain which is worse with weightbearing.  Review of Systems  Neurological: Negative for tingling and sensory change.     OBJECTIVE:  Appearance: alert, well appearing, and in no distress, oriented to person, place, and time and overweight. Foot/ankle exam: soft tissue swelling and tenderness at the MTP JOINT # 5 RIGHT FOOT PLANTAR ASPECT NO CALLOUS  Anterior drawer test is normal the anterior talofibular ligament is nontender there are no calluses on the plantar surface of the foot there is no atrophy skin is normal pulses are good and sensation is intact.  X-ray: Prior x-ray was negative for fracture.  ASSESSMENT: Right foot pain right metatarsophalangeal joint no history of fracture differential diagnosis synovitis versus capsular sprain  PLAN: The patient will try Spenco warm-and-form orthotics versus the heart sole shoe that she's been in  Follow-up 4 weeks

## 2017-09-05 ENCOUNTER — Encounter: Payer: Self-pay | Admitting: Orthopedic Surgery

## 2017-09-05 ENCOUNTER — Ambulatory Visit (INDEPENDENT_AMBULATORY_CARE_PROVIDER_SITE_OTHER): Payer: Medicare Other | Admitting: Orthopedic Surgery

## 2017-09-05 VITALS — BP 128/83 | HR 72 | Ht 61.0 in | Wt 223.0 lb

## 2017-09-05 DIAGNOSIS — S99922D Unspecified injury of left foot, subsequent encounter: Secondary | ICD-10-CM | POA: Diagnosis not present

## 2017-09-05 NOTE — Progress Notes (Signed)
Follow Up   HPI: prior visit:  [name] is a 66 y.o. female She is a 66 year old female who twisted her right foot on June 5 she  complains of pain over the right lateral foot near the fifth metatarsal and some discomfort at the anterior talofibular ligament. She is having some trouble walking. She describes a dull constant aching pain which is worse with weightbearing.  We advised her to get over-the-counter orthotics versus the hard sole shoe to try that.  She now reports: That she is pain-free in her foot  Review of systems she does report generalized ache  BP 128/83   Pulse 72   Ht  (1.549 m)   Wt 223 lb (101.2 kg)   BMI 42.14 kg/m  She is ambulating with a cane today  The foot is nontender full range of motion as been restored to the ankle ankle joint is stable  Encounter Diagnosis  Name Primary?  Marland Kitchen Foot injury, left, subsequent encounter Yes

## 2018-12-26 IMAGING — DX DG FOOT COMPLETE 3+V*R*
3 series · 3 of 3 positions shown · non-contrast
Comparison: None in PACs

CLINICAL DATA: Resting injury of the ankle yesterday. Persistent
anterior posterior foot pain and swelling with inability to bear
weight. History of previous foot fracture.

EXAM:
RIGHT FOOT COMPLETE - 3+ VIEW

[foot ap]
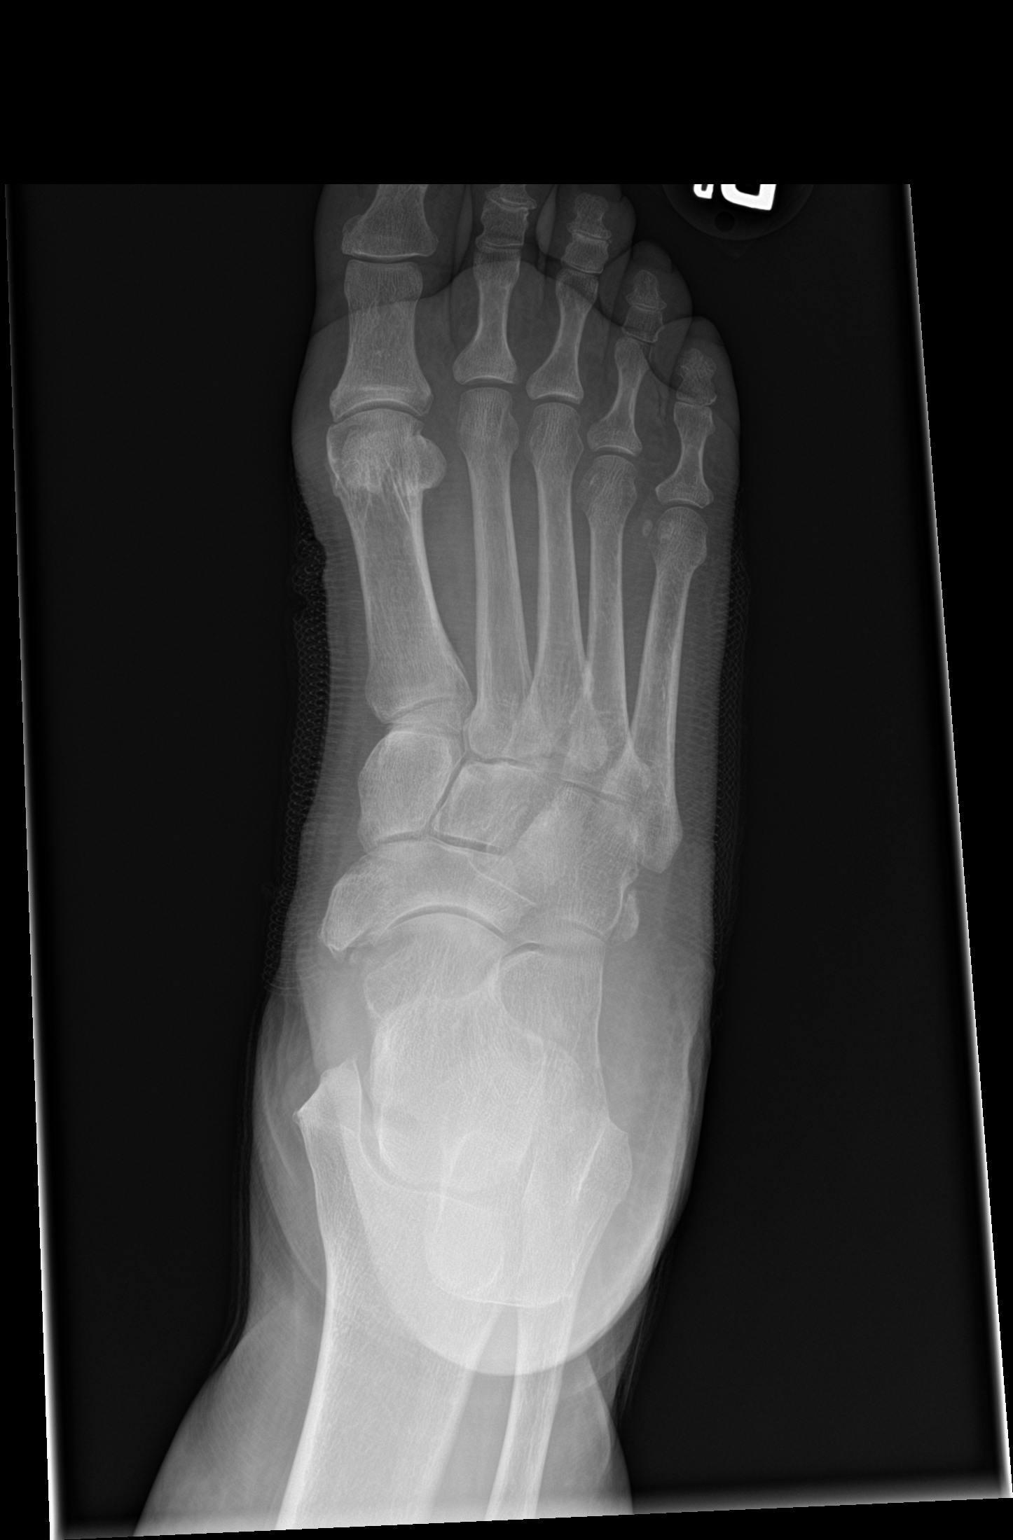

[foot obl]
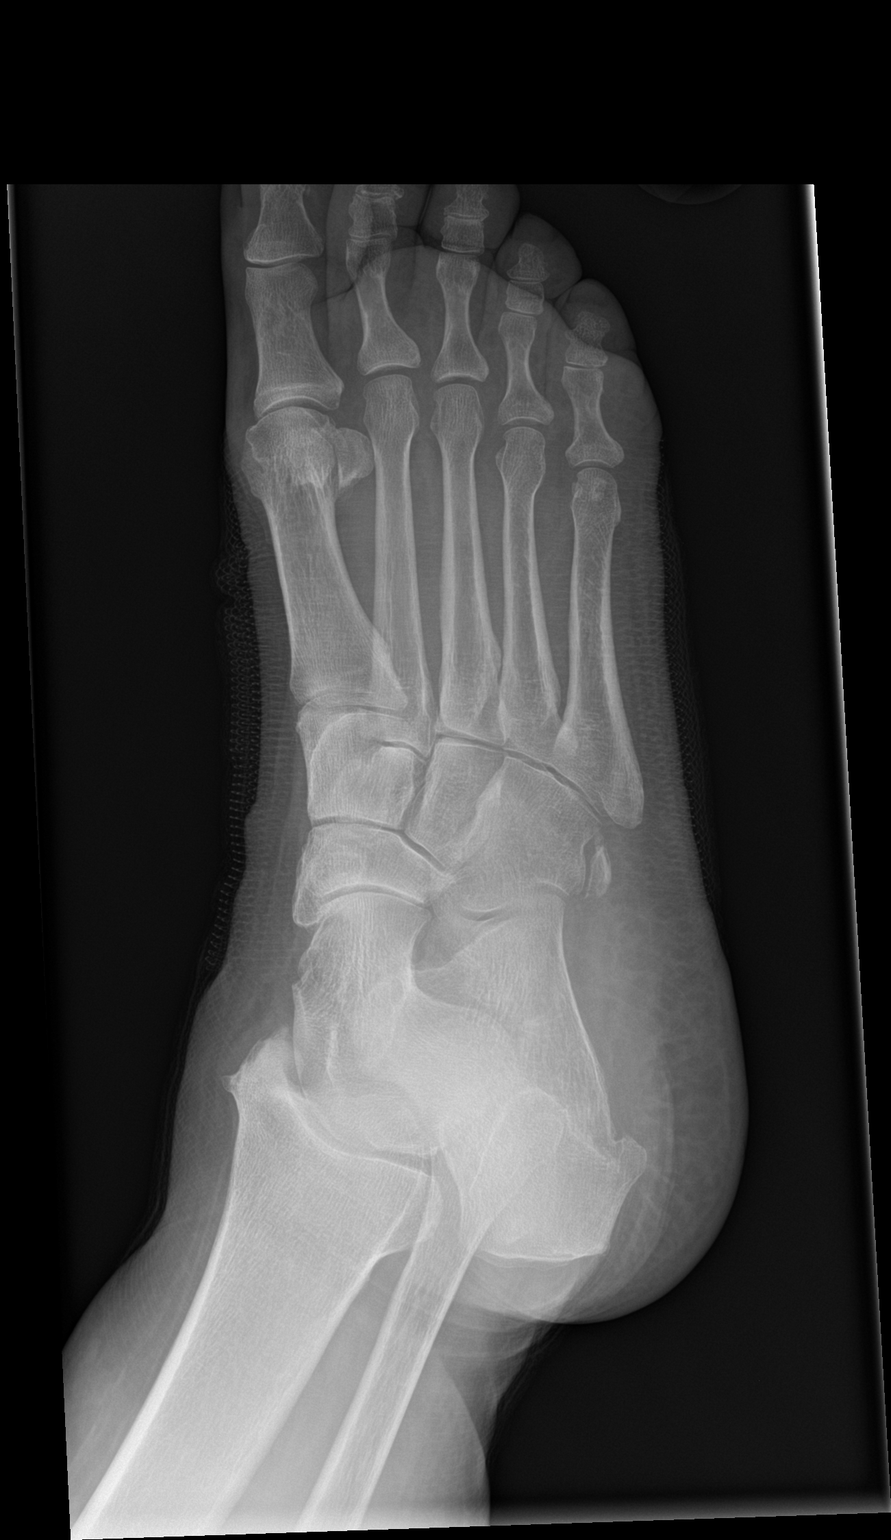

[foot lat]
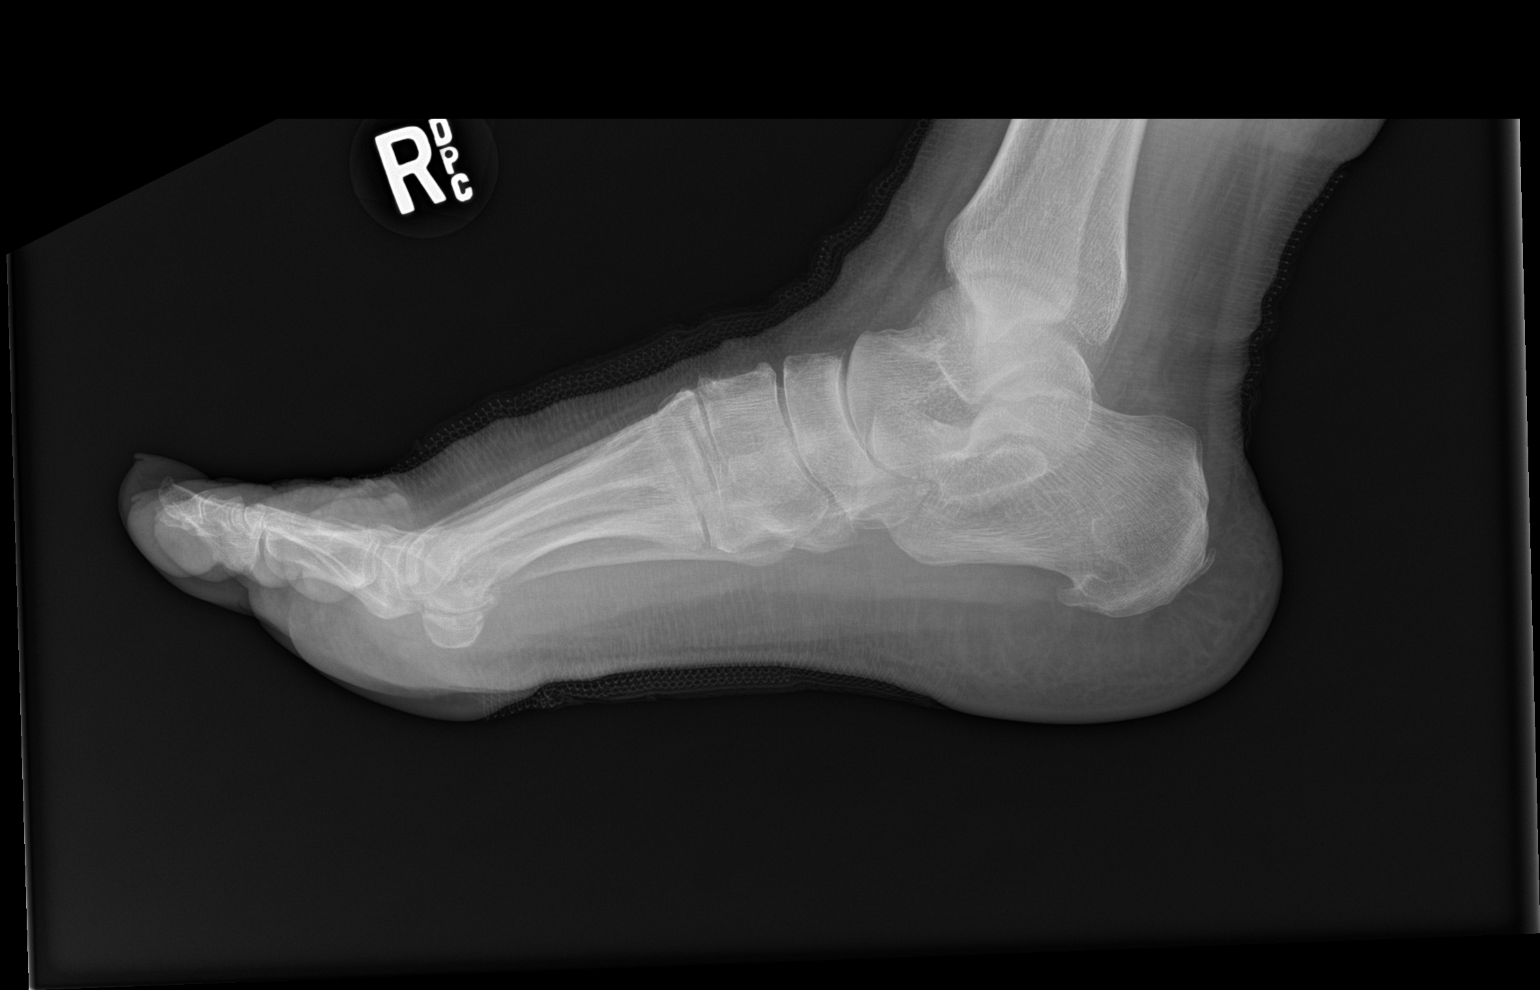

[3 of 3 positions shown; findings below may reference images not displayed]

FINDINGS: The bones of the right foot are subjectively adequately mineralized.
The there is mild narrowing of the first MTP joint space. The other
joint spaces appear well maintained. No acute fracture or
dislocation is observed. There are plantar and Achilles region
calcaneal spurs. There is mild soft tissue swelling over the dorsum
of the midfoot. The observed portions of the ankle exhibit no acute
abnormalities. Mild degenerative change of the tibiotalar joint is
noted.
IMPRESSION: No acute bony abnormality of the right foot is observed. There
degenerative changes of the first MTP joint and there are calcaneal
spurs.

Limited visualization of the ankle reveals degenerative change of
the tibiotalar joint. A dedicated ankle series is recommended.

## 2019-01-11 ENCOUNTER — Telehealth: Payer: Self-pay | Admitting: Orthopedic Surgery

## 2019-01-11 NOTE — Telephone Encounter (Signed)
Patient called to relay that she is continuing to wear compression gloves "copper gloves" for work, due to new contacts at her employer, [employer], have been questioning why she needs to wear gloves. She states  Dr Romeo Apple recommended them during one of her office visits in 2018 while being treated for a different problem (foot). She said they have worked fine, so she has not needed to come back.  May we issue a note for wearing compression gloves for work?

## 2019-01-12 ENCOUNTER — Encounter: Payer: Self-pay | Admitting: Orthopedic Surgery

## 2019-01-12 NOTE — Telephone Encounter (Signed)
Called patient to notify. Note issued accordingly.

## 2019-01-12 NOTE — Telephone Encounter (Signed)
yes

## 2020-04-23 NOTE — Telephone Encounter (Signed)
Error

## 2020-07-03 ENCOUNTER — Ambulatory Visit: Payer: Medicare Other | Admitting: Orthopedic Surgery

## 2020-07-21 ENCOUNTER — Ambulatory Visit: Payer: Medicare Other

## 2020-07-21 ENCOUNTER — Encounter: Payer: Self-pay | Admitting: Orthopedic Surgery

## 2020-07-21 ENCOUNTER — Ambulatory Visit (INDEPENDENT_AMBULATORY_CARE_PROVIDER_SITE_OTHER): Payer: Medicare Other | Admitting: Orthopedic Surgery

## 2020-07-21 ENCOUNTER — Other Ambulatory Visit: Payer: Self-pay

## 2020-07-21 VITALS — BP 137/91 | HR 70 | Ht 61.0 in | Wt 214.0 lb

## 2020-07-21 DIAGNOSIS — M1710 Unilateral primary osteoarthritis, unspecified knee: Secondary | ICD-10-CM

## 2020-07-21 DIAGNOSIS — Z6841 Body Mass Index (BMI) 40.0 and over, adult: Secondary | ICD-10-CM

## 2020-07-21 DIAGNOSIS — G8929 Other chronic pain: Secondary | ICD-10-CM | POA: Diagnosis not present

## 2020-07-21 DIAGNOSIS — M1712 Unilateral primary osteoarthritis, left knee: Secondary | ICD-10-CM | POA: Diagnosis not present

## 2020-07-21 NOTE — Patient Instructions (Signed)

## 2020-07-21 NOTE — Progress Notes (Signed)
NEW PROBLEM//OFFICE VISIT  Chief Complaint  Patient presents with  . Knee Pain    left knee worse in last few weeks ago, thinks she may need a new brace, not sure about surgery.     69 year old female presents with previous history of of left knee pain from osteoarthritis progressive varus deformity progressive medial compartment arthrosis.  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  She had an MRI in January 2012 which showed osteochondral injury bone bruise medial tibia subchondral fracture versus osteonecrosis same area possible ACL sprain  Now complains of medial joint line pain and some catching   Review of Systems  Constitutional: Negative for fever.  Neurological: Negative for tingling.     Past Medical History:  Diagnosis Date  . Allergic rhinitis   . Asthma   . Food allergy   . Thyroid disease     Past Surgical History:  Procedure Laterality Date  . CARPAL TUNNEL RELEASE     bilateral  . CHOLECYSTECTOMY    . KNEE SURGERY     left  . PARTIAL HYSTERECTOMY    . SMALL INTESTINE SURGERY    . THYROIDECTOMY    . TUMOR REMOVAL      Family History  Problem Relation Age of Onset  . Cancer Unknown    Social History   Tobacco Use  . Smoking status: Never Smoker  . Smokeless tobacco: Never Used  Substance Use Topics  . Alcohol use: No  . Drug use: No    Allergies  Allergen Reactions  . Beef (Bovine) Protein Anaphylaxis  . Chocolate Flavor Anaphylaxis  . Iopamidol Shortness Of Breath  . Peanut Oil Anaphylaxis  . Strawberry Extract Swelling    strawerries  . Latex Rash  . Milk-Related Compounds Hives  . Aspirin   . Bee Venom     Other reaction(s): Anaphylaxis  . Codeine   . Penicillins     Current Meds  Medication Sig  . albuterol (ACCUNEB) 0.63 MG/3ML nebulizer solution  Inhale into the lungs.  . AMLODIPINE BESYLATE PO Take by mouth.  . Calcium 1500 MG tablet Take 1,500 mg by mouth.    . cyanocobalamin (,VITAMIN B-12,) 1000 MCG/ML injection Inject 1,000 mcg into the muscle.  Marland Kitchen EPINEPHrine 0.3 mg/0.3 mL IJ SOAJ injection as needed.  . Fluticasone-Salmeterol (ADVAIR) 250-50 MCG/DOSE AEPB Inhale 1 puff into the lungs every 12 (twelve) hours.    Marland Kitchen ipratropium (ATROVENT) 0.02 % nebulizer solution   . loratadine (CLARITIN) 10 MG tablet Take 10 mg by mouth daily.    . montelukast (SINGULAIR) 10 MG tablet Take 10 mg by mouth at bedtime.    Marland Kitchen octreotide (SANDOSTATIN LAR) 30 MG injection Inject into the muscle.  . predniSONE (DELTASONE) 10 MG tablet as needed.   Marland Kitchen SYNTHROID 150 MCG tablet Take 150 mcg by mouth daily.  . Vitamin D, Ergocalciferol, (DRISDOL) 50000 UNITS CAPS Take 50,000 Units by mouth.      BP (!) 137/91   Pulse 70   Ht 5\' 1"  (1.549 m)   Wt 214 lb (97.1 kg)   BMI 40.43 kg/m   The patient meets the AMA guidelines for Morbid (severe) obesity with a BMI > 40.0 and I have recommended weight loss.  Physical Exam Constitutional:      Appearance: Normal appearance.  Musculoskeletal:  Left knee: No effusion.     Instability Tests: Medial McMurray test negative.  Skin:    General: Skin is warm and dry.     Capillary Refill: Capillary refill takes less than 2 seconds.  Neurological:     General: No focal deficit present.     Mental Status: She is alert and oriented to person, place, and time.  Psychiatric:        Thought Content: Thought content normal.        Judgment: Judgment normal.     Left Knee Exam   Muscle Strength  The patient has normal left knee strength.  Tenderness  The patient is experiencing tenderness in the medial joint line.  Range of Motion  Extension: 5  Flexion: 120   Tests  McMurray:  Medial - negative  Varus: negative  Drawer:  Anterior - negative     Posterior - negative  Other  Erythema:  absent Sensation: normal Pulse: present Swelling: none Effusion: no effusion present        MEDICAL DECISION MAKING  A.  Encounter Diagnosis  Name Primary?  . Chronic pain of left knee Yes    B. 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  Lateral compartment osteophyte and patellofemoral osteophytes and joint space narrowing consistent with osteoarthritis medial patellofemoral and lateral  Orders: Brace was ordered, braces that we had did not fit  Outside records reviewed: None  C. MANAGEMENT bracing  F/U PRN   No orders of the defined types were placed in this encounter.     Fuller Canada, MD  07/21/2020 8:52 AM

## 2021-04-20 ENCOUNTER — Telehealth: Payer: Self-pay | Admitting: Orthopedic Surgery

## 2021-04-20 NOTE — Telephone Encounter (Signed)
Patient called to inquire if any openings with Dr Romeo Apple after 5 may be available; discussed availability and the need for the appointment - patient said she thinks she may have "broken the same foot (right) again".  Relayed option of [facility] or emergency room, or primary care, which she said is right there in [place].  Patient will first contact primary care, and aware to  let us know if an appointment is needed once she has had visit with PCP or urgent care/E.R.

## 2021-05-04 ENCOUNTER — Other Ambulatory Visit: Payer: Self-pay

## 2021-05-04 ENCOUNTER — Ambulatory Visit: Payer: Medicare Other

## 2021-05-04 ENCOUNTER — Ambulatory Visit (INDEPENDENT_AMBULATORY_CARE_PROVIDER_SITE_OTHER): Payer: Medicare Other | Admitting: Orthopedic Surgery

## 2021-05-04 ENCOUNTER — Encounter: Payer: Self-pay | Admitting: Orthopedic Surgery

## 2021-05-04 VITALS — BP 127/84 | HR 73 | Ht 61.0 in

## 2021-05-04 DIAGNOSIS — G8929 Other chronic pain: Secondary | ICD-10-CM

## 2021-05-04 DIAGNOSIS — M79671 Pain in right foot: Secondary | ICD-10-CM

## 2021-05-04 NOTE — Progress Notes (Signed)
Chief Complaint  Patient presents with  . Foot Pain    Patient reports she re-injured right foot about 1 month or so.    70 year old female who injured her foot about a month ago complains of pain lateral side dorsal and MTP joints associated with some swelling and a mild limp  Past Medical History:  Diagnosis Date  . Allergic rhinitis   . Asthma   . Food allergy   . Thyroid disease     BP 127/84   Pulse 73   Ht 5\' 1"  (1.549 m)   BMI 40.43 kg/m   Normal development grooming and hygiene  Normal body habitus  Awake and alert  The patient meets the AMA guidelines for Morbid (severe) obesity with a BMI > 40.0 and I have recommended weight loss.  Right foot is swollen and tender over the lateral proximal fifth metatarsal with swelling  X-ray shows possible avulsion type fracture there versus os versalineum   Recommend hard soled shoe  Repeat exam in 4 weeks

## 2021-06-08 ENCOUNTER — Ambulatory Visit (INDEPENDENT_AMBULATORY_CARE_PROVIDER_SITE_OTHER): Payer: Medicare Other | Admitting: Orthopedic Surgery

## 2021-06-08 ENCOUNTER — Other Ambulatory Visit: Payer: Self-pay

## 2021-06-08 ENCOUNTER — Encounter: Payer: Self-pay | Admitting: Orthopedic Surgery

## 2021-06-08 VITALS — BP 113/79 | HR 89 | Ht 61.0 in | Wt 214.0 lb

## 2021-06-08 DIAGNOSIS — M79671 Pain in right foot: Secondary | ICD-10-CM | POA: Diagnosis not present

## 2021-06-08 DIAGNOSIS — G8929 Other chronic pain: Secondary | ICD-10-CM | POA: Diagnosis not present

## 2021-06-08 DIAGNOSIS — I10 Essential (primary) hypertension: Secondary | ICD-10-CM | POA: Insufficient documentation

## 2021-06-08 NOTE — Progress Notes (Signed)
FOLLOW UP   Encounter Diagnosis  Name Primary?   Chronic foot pain, right Yes     Chief Complaint  Patient presents with   Foot Pain    Right/ feels better      70 year old female with pain over the lateral side of the right foot with most likely a fracture presents with improved pain levels walking pretty well in her postop shoe  Exam shows no tenderness or swelling normal range of motion of the foot  Patient is released and discharged follow-up as needed

## 2022-07-13 DEATH — deceased
